# Patient Record
Sex: Female | Born: 1990
Health system: Southern US, Community
[De-identification: ages and names within clinical notes are randomized; demographics above are authoritative.]

## PROBLEM LIST (undated history)

## (undated) ENCOUNTER — Inpatient Hospital Stay (HOSPITAL_COMMUNITY): Payer: Self-pay

---

## 2010-10-04 ENCOUNTER — Emergency Department (HOSPITAL_COMMUNITY)
Admission: EM | Admit: 2010-10-04 | Discharge: 2010-10-04 | Payer: Self-pay | Source: Home / Self Care | Admitting: Emergency Medicine
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
## 2012-07-08 ENCOUNTER — Ambulatory Visit: Payer: Self-pay | Admitting: General Practice

## 2013-05-12 ENCOUNTER — Other Ambulatory Visit: Payer: Self-pay | Admitting: *Deleted

## 2013-05-12 DIAGNOSIS — IMO0001 Reserved for inherently not codable concepts without codable children: Secondary | ICD-10-CM

## 2013-05-12 MED ORDER — NORGESTIM-ETH ESTRAD TRIPHASIC 0.18/0.215/0.25 MG-25 MCG PO TABS: 1.0000 | ORAL_TABLET | Freq: Every day | ORAL | Status: DC

## 2013-05-12 NOTE — Telephone Encounter (Signed)
Patient request RF of her Trisprentec- due annual exam 09/2013

## 2013-05-23 ENCOUNTER — Inpatient Hospital Stay (HOSPITAL_COMMUNITY)
Admission: AD | Admit: 2013-05-23 | Discharge: 2013-05-23 | Disposition: A | Discharge status: Home / Self Care | Payer: 59 | Source: Ambulatory Visit | Attending: Obstetrics | Admitting: Obstetrics

## 2013-05-23 ENCOUNTER — Inpatient Hospital Stay (HOSPITAL_COMMUNITY): Payer: 59

## 2013-05-23 ENCOUNTER — Encounter (HOSPITAL_COMMUNITY): Payer: Self-pay | Admitting: *Deleted

## 2013-05-23 DIAGNOSIS — O99891 Other specified diseases and conditions complicating pregnancy: Secondary | ICD-10-CM | POA: Insufficient documentation

## 2013-05-23 DIAGNOSIS — O26899 Other specified pregnancy related conditions, unspecified trimester: Secondary | ICD-10-CM

## 2013-05-23 DIAGNOSIS — R109 Unspecified abdominal pain: Secondary | ICD-10-CM | POA: Insufficient documentation

## 2013-05-23 LAB — CBC
Hemoglobin: 12.8 g/dL (ref 12.0–15.0)
MCH: 28.6 pg (ref 26.0–34.0)
MCHC: 33.4 g/dL (ref 30.0–36.0)
Platelets: 241 10*3/uL (ref 150–400)
RDW: 12.6 % (ref 11.5–15.5)

## 2013-05-23 LAB — URINALYSIS, ROUTINE W REFLEX MICROSCOPIC
Bilirubin Urine: NEGATIVE
Glucose, UA: NEGATIVE mg/dL
Hgb urine dipstick: NEGATIVE
Ketones, ur: NEGATIVE mg/dL
Protein, ur: NEGATIVE mg/dL
Urobilinogen, UA: 1 mg/dL (ref 0.0–1.0)

## 2013-05-23 LAB — ABO/RH: ABO/RH(D): O POS

## 2013-05-23 LAB — WET PREP, GENITAL
Clue Cells Wet Prep HPF POC: NONE SEEN
Trich, Wet Prep: NONE SEEN
Yeast Wet Prep HPF POC: NONE SEEN

## 2013-05-23 NOTE — MAU Note (Signed)
Took a Plan B in August; has been divorced for 1 month and this is her ex's baby-  Frustrated with the timing of the pregnancy; crying;

## 2013-05-23 NOTE — MAU Provider Note (Signed)
History     CSN: [id number]  Arrival date and time: 05/23/13 1532   First Provider Initiated Contact with Patient 05/23/13 1812      Chief Complaint  Patient presents with  . Possible Pregnancy   HPI  [name] is a 22 y.o. female, G1 at [redacted]w[redacted]d who presents with lower abdominal pain that started 4 days ago; the pain is directly in the middle of her lower abdomen. This was an unexpected pregnancy for the patient. She took the morning after pill the day after she had unprotected intercourse and was very surprised to find out today that she was pregnant. The pain started 5 days ago.  She rates her pain a 3/10; when she bends over the pain is a 7/10. The pain is in her mid abdomen; constant, achey and it kept her up most of the night.    OB History   Grav Para Term Preterm Abortions TAB SAB Ect Mult Living   1               History reviewed. No pertinent past medical history.  History reviewed. No pertinent past surgical history.  Family History  Problem Relation Age of Onset  . Alcohol abuse Neg Hx   . Arthritis Neg Hx   . Asthma Neg Hx   . Birth defects Neg Hx   . Cancer Neg Hx   . COPD Neg Hx   . Depression Neg Hx   . Diabetes Neg Hx   . Drug abuse Neg Hx   . Early death Neg Hx   . Hearing loss Neg Hx   . Heart disease Neg Hx   . Hyperlipidemia Neg Hx   . Hypertension Neg Hx   . Kidney disease Neg Hx   . Learning disabilities Neg Hx   . Mental illness Neg Hx   . Mental retardation Neg Hx   . Miscarriages / Stillbirths Neg Hx   . Stroke Neg Hx   . Vision loss Neg Hx     History  Substance Use Topics  . Smoking status: Never Smoker   . Smokeless tobacco: Not on file  . Alcohol Use: No    Allergies: No Known Allergies  Prescriptions prior to admission  Medication Sig Dispense Refill  . levonorgestrel (PLAN B,NEXT CHOICE) 0.75 MG tablet Take 0.75 mg by mouth every 12 (twelve) hours.      . [DISCONTINUED] Norgestimate-Ethinyl Estradiol Triphasic (ORTHO  TRI-CYCLEN LO) 0.18/0.215/0.25 MG-25 MCG tab Take 1 tablet by mouth daily.  1 Package  5   Results for orders placed during the hospital encounter of 05/23/13 (from the past 24 hour(s))  URINALYSIS, ROUTINE W REFLEX MICROSCOPIC     Status: Abnormal   Collection Time    05/23/13  4:20 PM      Result Value Range   Color, Urine YELLOW  YELLOW   APPearance CLEAR  CLEAR   Specific Gravity, Urine 1.015  1.005 - 1.030   pH 6.5  5.0 - 8.0   Glucose, UA NEGATIVE  NEGATIVE mg/dL   Hgb urine dipstick NEGATIVE  NEGATIVE   Bilirubin Urine NEGATIVE  NEGATIVE   Ketones, ur NEGATIVE  NEGATIVE mg/dL   Protein, ur NEGATIVE  NEGATIVE mg/dL   Urobilinogen, UA 1.0  0.0 - 1.0 mg/dL   Nitrite NEGATIVE  NEGATIVE   Leukocytes, UA TRACE (*) NEGATIVE  URINE MICROSCOPIC-ADD ON     Status: Abnormal   Collection Time    05/23/13  4:20 PM  Result Value Range   Squamous Epithelial / LPF FEW (*) RARE   WBC, UA 0-2  <3 WBC/hpf  POCT PREGNANCY, URINE     Status: Abnormal   Collection Time    05/23/13  4:50 PM      Result Value Range   Preg Test, Ur POSITIVE (*) NEGATIVE  WET PREP, GENITAL     Status: Abnormal   Collection Time    05/23/13  6:45 PM      Result Value Range   Yeast Wet Prep HPF POC NONE SEEN  NONE SEEN   Trich, Wet Prep NONE SEEN  NONE SEEN   Clue Cells Wet Prep HPF POC NONE SEEN  NONE SEEN   WBC, Wet Prep HPF POC FEW (*) NONE SEEN  CBC     Status: None   Collection Time    05/23/13  7:00 PM      Result Value Range   WBC 8.1  4.0 - 10.5 K/uL   RBC 4.48  3.87 - 5.11 MIL/uL   Hemoglobin 12.8  12.0 - 15.0 g/dL   HCT 57.8  46.9 - 62.9 %   MCV 85.5  78.0 - 100.0 fL   MCH 28.6  26.0 - 34.0 pg   MCHC 33.4  30.0 - 36.0 g/dL   RDW 52.8  41.3 - 24.4 %   Platelets 241  150 - 400 K/uL  HCG, QUANTITATIVE, PREGNANCY     Status: Abnormal   Collection Time    05/23/13  7:00 PM      Result Value Range   hCG, Beta Chain, Quant, S 220 (*) <5 mIU/mL    Review of Systems  Constitutional:  Positive for chills. Negative for fever.  Eyes: Negative for blurred vision.  Gastrointestinal: Positive for nausea, abdominal pain and diarrhea. Negative for heartburn, vomiting and constipation.       Middle abdominal pain, worse when she bends over.  The pain affected her sleep last night   Genitourinary: Positive for frequency. Negative for dysuria and urgency.       No vaginal discharge. No vaginal bleeding. No dysuria.   Neurological: Positive for headaches.   Physical Exam   Blood pressure 122/88, pulse 89, temperature 98.7 F (37.1 C), temperature source Oral, resp. rate 16, height 5\' 7"  (1.702 m), weight 66.134 kg (145 lb 12.8 oz), last menstrual period 04/15/2013, SpO2 100.00%.  Physical Exam  Constitutional: She is oriented to person, place, and time. She appears well-developed and well-nourished. No distress.  HENT:  Head: Normocephalic.  Eyes: Pupils are equal, round, and reactive to light.  Neck: Neck supple.  GI: Soft. She exhibits no distension. There is tenderness. There is guarding. There is no rebound.  Lower, mid abdominal pain.  Genitourinary:  Speculum exam: Vagina - Small amount of creamy discharge, no odor Cervix - minimal contact bleeding; ectropion noted  Bimanual exam: Cervix closed Uterus non tender, Gravid; normal size Mid/left adnexal tenderness, no masses bilaterally GC/Chlam, wet prep done Chaperone present for exam.   Neurological: She is alert and oriented to person, place, and time.  Skin: Skin is warm. She is not diaphoretic.    Results for orders placed during the hospital encounter of 05/23/13 (from the past 24 hour(s))  URINALYSIS, ROUTINE W REFLEX MICROSCOPIC     Status: Abnormal   Collection Time    05/23/13  4:20 PM      Result Value Range   Color, Urine YELLOW  YELLOW   APPearance CLEAR  CLEAR  Specific Gravity, Urine 1.015  1.005 - 1.030   pH 6.5  5.0 - 8.0   Glucose, UA NEGATIVE  NEGATIVE mg/dL   Hgb urine dipstick  NEGATIVE  NEGATIVE   Bilirubin Urine NEGATIVE  NEGATIVE   Ketones, ur NEGATIVE  NEGATIVE mg/dL   Protein, ur NEGATIVE  NEGATIVE mg/dL   Urobilinogen, UA 1.0  0.0 - 1.0 mg/dL   Nitrite NEGATIVE  NEGATIVE   Leukocytes, UA TRACE (*) NEGATIVE  URINE MICROSCOPIC-ADD ON     Status: Abnormal   Collection Time    05/23/13  4:20 PM      Result Value Range   Squamous Epithelial / LPF FEW (*) RARE   WBC, UA 0-2  <3 WBC/hpf  POCT PREGNANCY, URINE     Status: Abnormal   Collection Time    05/23/13  4:50 PM      Result Value Range   Preg Test, Ur POSITIVE (*) NEGATIVE  WET PREP, GENITAL     Status: Abnormal   Collection Time    05/23/13  6:45 PM      Result Value Range   Yeast Wet Prep HPF POC NONE SEEN  NONE SEEN   Trich, Wet Prep NONE SEEN  NONE SEEN   Clue Cells Wet Prep HPF POC NONE SEEN  NONE SEEN   WBC, Wet Prep HPF POC FEW (*) NONE SEEN  CBC     Status: None   Collection Time    05/23/13  7:00 PM      Result Value Range   WBC 8.1  4.0 - 10.5 K/uL   RBC 4.48  3.87 - 5.11 MIL/uL   Hemoglobin 12.8  12.0 - 15.0 g/dL   HCT 16.1  09.6 - 04.5 %   MCV 85.5  78.0 - 100.0 fL   MCH 28.6  26.0 - 34.0 pg   MCHC 33.4  30.0 - 36.0 g/dL   RDW 40.9  81.1 - 91.4 %   Platelets 241  150 - 400 K/uL  ABO/RH     Status: None   Collection Time    05/23/13  7:00 PM      Result Value Range   ABO/RH(D) O POS    HCG, QUANTITATIVE, PREGNANCY     Status: Abnormal   Collection Time    05/23/13  7:00 PM      Result Value Range   hCG, Beta Chain, Quant, S 220 (*) <5 mIU/mL   Clinical Data: Abdominal pain. Positive pregnancy test. 5 weeks 3  days pregnant by last menstrual period. Beta HCG of 220.  OBSTETRIC <14 WK [place] AND TRANSVAGINAL OB US  Technique: Both transabdominal and transvaginal ultrasound  examinations were performed for complete evaluation of the  gestation as well as the maternal uterus, adnexal regions, and  pelvic cul-de-sac. Transvaginal technique was performed to assess  early  pregnancy.  Comparison: None.  Intrauterine gestational sac: Not visualized. Equivocal trace  endometrial fluid on image 35. 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  Yolk sac: Not visualized  Embryo: Not visualized  Cardiac Activity: None visualized  Maternal uterus/adnexae:  Left ovary only visualized transabdominally and within normal  limits. A right ovarian corpus luteal cyst measures 2.1 x 1.5 x  1.9 cm on image 29.  No significant free fluid.  IMPRESSION:  Lack of visualization of intrauterine gestational sac, yolk sac, or  embryo. Given the low beta HCG level and the gestational age by  last menstrual period, this is nonspecific. Considerations include  early non  visualized intrauterine pregnancy, nonviable pregnancy,  or ectopic pregnancy. Short-term imaging and beta HCG follow-up  suggested.  Right ovarian corpus luteal cyst.  Original Report Authenticated By: Jeronimo Greaves, M.D.     MAU Course  Procedures  MDM CBC Beta Hcg ABO/RH Wet prep GC/Chlamydia   [place]   LABS and Ultrasound results were reviewed with the patient. Assessment and Plan   Report given to Jeani Sow FNP who resumes care of the patient at 20:00 [facility], JENNIFER IRENE FNP-C 05/23/2013, 7:51 PM   A:  Early pregnancy with lower abdominal pain  [redacted]w[redacted]d by LMP       P:  Return on Sunday, 05/25/13 for repeat BHCG      Return for worsening sxs

## 2013-05-23 NOTE — MAU Note (Signed)
Upset about + UPT;

## 2013-05-23 NOTE — MAU Note (Signed)
Patient states she has positive home pregnancy tests. Has been having bad cramping for about 5 days. Nausea, chills, diarrhea.

## 2013-05-24 LAB — GC/CHLAMYDIA PROBE AMP
CT Probe RNA: NEGATIVE
GC Probe RNA: NEGATIVE

## 2013-05-25 ENCOUNTER — Inpatient Hospital Stay (HOSPITAL_COMMUNITY)
Admission: AD | Admit: 2013-05-25 | Discharge: 2013-05-25 | Disposition: A | Discharge status: Home / Self Care | Payer: 59 | Source: Ambulatory Visit | Attending: Obstetrics | Admitting: Obstetrics

## 2013-05-25 DIAGNOSIS — N831 Corpus luteum cyst of ovary, unspecified side: Secondary | ICD-10-CM | POA: Insufficient documentation

## 2013-05-25 DIAGNOSIS — O26899 Other specified pregnancy related conditions, unspecified trimester: Secondary | ICD-10-CM

## 2013-05-25 DIAGNOSIS — O99891 Other specified diseases and conditions complicating pregnancy: Secondary | ICD-10-CM | POA: Insufficient documentation

## 2013-05-25 DIAGNOSIS — R109 Unspecified abdominal pain: Secondary | ICD-10-CM | POA: Insufficient documentation

## 2013-05-25 DIAGNOSIS — O34599 Maternal care for other abnormalities of gravid uterus, unspecified trimester: Secondary | ICD-10-CM | POA: Insufficient documentation

## 2013-05-25 MED ORDER — PRENATAL PLUS 27-1 MG PO TABS: 1.0000 | ORAL_TABLET | Freq: Every day | ORAL | Status: AC

## 2013-05-25 NOTE — MAU Note (Signed)
Pt states here for follow up bhcg, having pain still, however better than 2 days ago. Denies bleeding. Feels achy pain at present, more like discomfort.

## 2013-05-25 NOTE — MAU Provider Note (Signed)
Chief Complaint: Follow-up   None    SUBJECTIVE HPI: Amy Pratt is a 22 y.o. G1P0 at [redacted]w[redacted]d by LMP who presents for F/U quant. She is no longer having any abd pain and has had no VB.   No past medical history on file. OB History  Gravida Para Term Preterm AB SAB TAB Ectopic Multiple Living  1             # Outcome Date GA Lbr Len/2nd Weight Sex Delivery Anes PTL Lv  1 CUR              No past surgical history on file. History   Social History  . Marital Status: Divorced    Spouse Name: N/A    Number of Children: N/A  . Years of Education: N/A   Occupational History  . Not on file.   Social History Main Topics  . Smoking status: Never Smoker   . Smokeless tobacco: Not on file  . Alcohol Use: No  . Drug Use: No  . Sexual Activity: Not on file   Other Topics Concern  . Not on file   Social History Narrative  . No narrative on file   No current facility-administered medications on file prior to encounter.   No current outpatient prescriptions on file prior to encounter.   No Known Allergies  ROS: Pertinent items in HPI  OBJECTIVE Blood pressure 113/75, pulse 78, temperature 98.2 F (36.8 C), temperature source Oral, resp. rate 16, height 5\' 7"  (1.702 m), weight 65.431 kg (144 lb 4 oz), last menstrual period 04/15/2013. GENERAL: Well-developed, well-nourished female in no acute distress.  HEENT: Normocephalic HEART: normal rate RESP: normal effort ABDOMEN: Soft, non-tender EXTREMITIES: Nontender, no edema NEURO: Alert and oriented  LAB RESULTS Results for orders placed during the hospital encounter of 05/25/13 (from the past 24 hour(s))  HCG, QUANTITATIVE, PREGNANCY     Status: Abnormal   Collection Time    05/25/13  5:37 PM      Result Value Range   hCG, Beta Chain, Quant, S 691 (*) <5 mIU/mL    IMAGING US Ob Comp Less 14 Wks  05/23/2013   *RADIOLOGY REPORT*  Clinical Data: Abdominal pain.  Positive pregnancy test.  5 weeks 3 days pregnant by last  menstrual period.  Beta HCG of 220.  OBSTETRIC <14 WK [place] AND TRANSVAGINAL OB US  Technique:  Both transabdominal and transvaginal ultrasound examinations were performed for complete evaluation of the gestation as well as the maternal uterus, adnexal regions, and pelvic cul-de-sac.  Transvaginal technique was performed to assess early pregnancy.  Comparison:  None.  Intrauterine gestational sac:  Not visualized.  Equivocal trace endometrial fluid on image 35.  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  Yolk sac: Not visualized Embryo: Not visualized Cardiac Activity: None visualized  Maternal uterus/adnexae: Left ovary only visualized transabdominally and within normal limits.  A right ovarian corpus luteal cyst measures 2.1 x 1.5 x 1.9 cm on image 29.  No significant free fluid.  IMPRESSION: Lack of visualization of intrauterine gestational sac, yolk sac, or embryo.  Given the low beta HCG level and the gestational age by last menstrual period, this is nonspecific.  Considerations include early non visualized intrauterine pregnancy, nonviable pregnancy, or ectopic pregnancy.  Short-term imaging and beta HCG follow-up suggested.  Right ovarian corpus luteal cyst.   Original Report Authenticated By: Jeronimo Greaves, M.D.   US Ob Transvaginal  05/23/2013   *RADIOLOGY REPORT*  Clinical Data: Abdominal pain.  Positive  pregnancy test.  5 weeks 3 days pregnant by last menstrual period.  Beta HCG of 220.  OBSTETRIC <14 WK [place] AND TRANSVAGINAL OB US  Technique:  Both transabdominal and transvaginal ultrasound examinations were performed for complete evaluation of the gestation as well as the maternal uterus, adnexal regions, and pelvic cul-de-sac.  Transvaginal technique was performed to assess early pregnancy.  Comparison:  None.  Intrauterine gestational sac:  Not visualized.  Equivocal trace endometrial fluid on image 35.  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  Yolk sac: Not visualized Embryo: Not visualized Cardiac Activity: None visualized   Maternal uterus/adnexae: Left ovary only visualized transabdominally and within normal limits.  A right ovarian corpus luteal cyst measures 2.1 x 1.5 x 1.9 cm on image 29.  No significant free fluid.  IMPRESSION: Lack of visualization of intrauterine gestational sac, yolk sac, or embryo.  Given the low beta HCG level and the gestational age by last menstrual period, this is nonspecific.  Considerations include early non visualized intrauterine pregnancy, nonviable pregnancy, or ectopic pregnancy.  Short-term imaging and beta HCG follow-up suggested.  Right ovarian corpus luteal cyst.   Original Report Authenticated By: Jeronimo Greaves, M.D.    MAU COURSE  ASSESSMENT 1. Abdominal pain in pregnancy   Appropriate rise in quant beta hCG  PLAN Discharge home with reassurance    Medication List         prenatal vitamin w/FE, FA 27-1 MG Tabs tablet  Take 1 tablet by mouth daily.       F/U US for viability in 1 wk with result to Dr. Clearance Coots. Pt to schedule her appointment there next week.     Danae Orleans, CNM 05/25/2013  6:24 PM

## 2013-06-04 ENCOUNTER — Ambulatory Visit (HOSPITAL_COMMUNITY): Payer: 59 | Attending: Obstetrics and Gynecology

## 2013-06-11 ENCOUNTER — Encounter: Payer: Self-pay | Admitting: Obstetrics

## 2013-07-03 ENCOUNTER — Encounter: Payer: Self-pay | Admitting: Obstetrics

## 2013-07-24 ENCOUNTER — Other Ambulatory Visit: Payer: Self-pay

## 2013-08-07 ENCOUNTER — Other Ambulatory Visit: Payer: Self-pay | Admitting: *Deleted

## 2013-08-07 MED ORDER — NORGESTIM-ETH ESTRAD TRIPHASIC 0.18/0.215/0.25 MG-25 MCG PO TABS: 1.0000 | ORAL_TABLET | Freq: Every day | ORAL | Status: DC

## 2014-03-10 ENCOUNTER — Encounter (HOSPITAL_COMMUNITY): Payer: Self-pay

## 2014-03-28 ENCOUNTER — Encounter (HOSPITAL_COMMUNITY): Payer: Self-pay | Admitting: *Deleted

## 2014-07-07 ENCOUNTER — Other Ambulatory Visit: Payer: Self-pay | Admitting: Obstetrics

## 2014-07-20 ENCOUNTER — Encounter (HOSPITAL_COMMUNITY): Payer: Self-pay | Admitting: *Deleted

## 2014-08-21 ENCOUNTER — Other Ambulatory Visit: Payer: Self-pay | Admitting: Obstetrics

## 2014-09-11 IMAGING — US US OB TRANSVAGINAL
1 series · 13 of 28 positions shown · non-contrast
Comparison: None.

CLINICAL DATA: Abdominal pain.  Positive pregnancy test.  5 weeks 3
days pregnant by last menstrual period.  Beta HCG of 220.

OBSTETRIC <14 WK US AND TRANSVAGINAL OB US
TECHNIQUE: Both transabdominal and transvaginal ultrasound
examinations were performed for complete evaluation of the
gestation as well as the maternal uterus, adnexal regions, and
pelvic cul-de-sac.  Transvaginal technique was performed to assess
early pregnancy.

[Series 1: us ob comp less 14 wks · 13 of 34 slices shown]
[im 2/34]
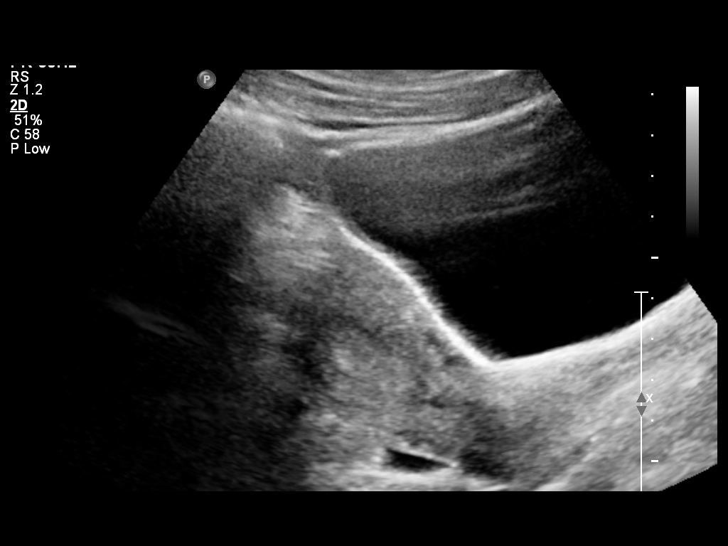
[im 4/34]
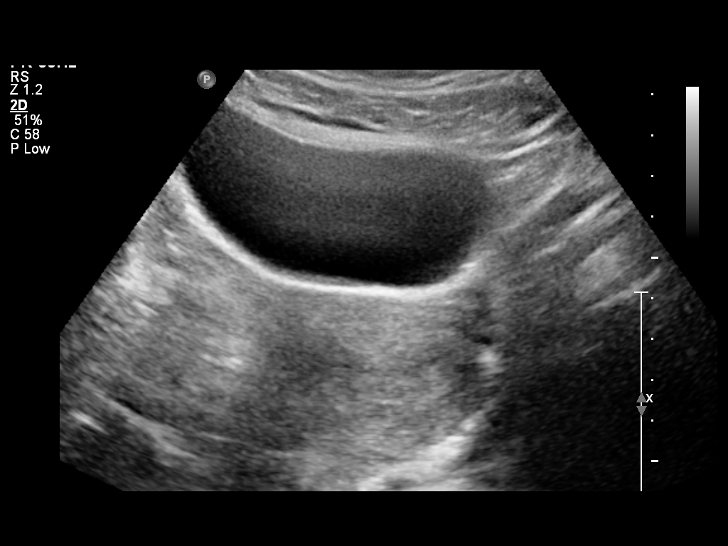
[im 7/34]
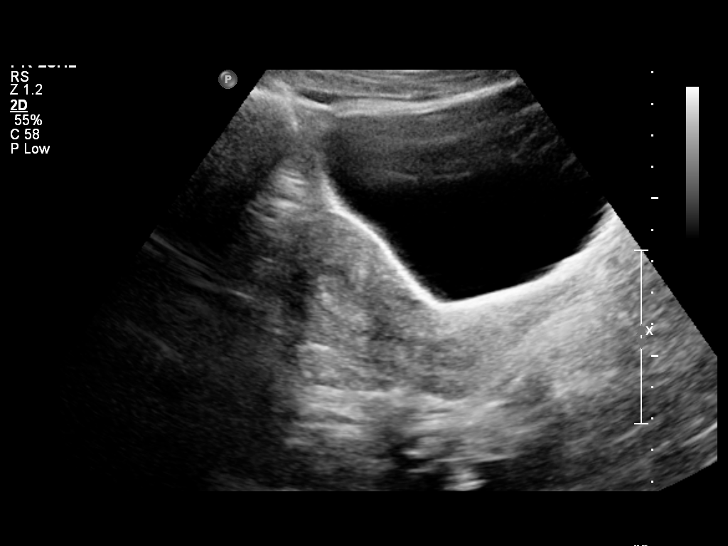
[im 9/34]
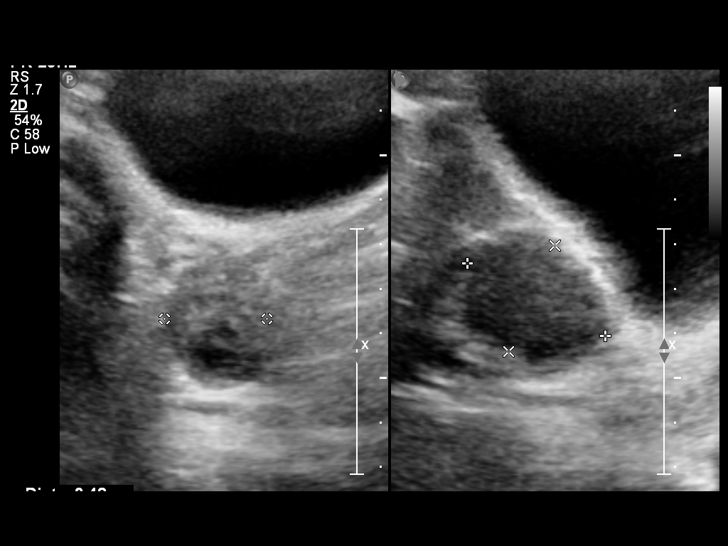
[im 12/34]
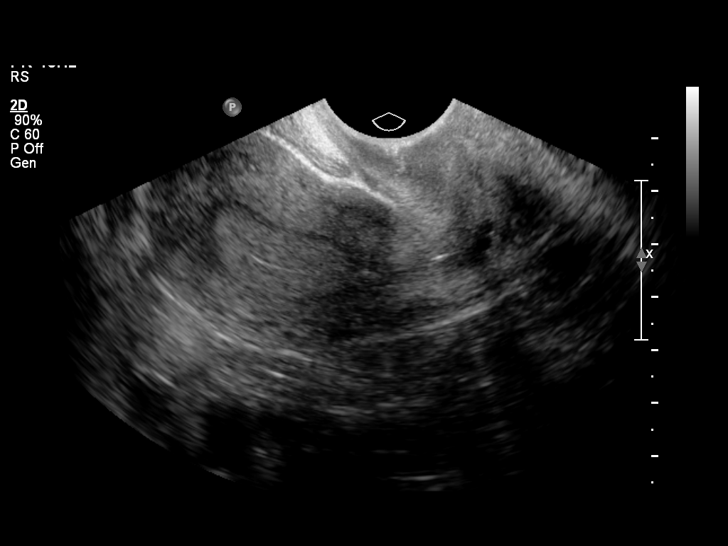
[im 14/34]
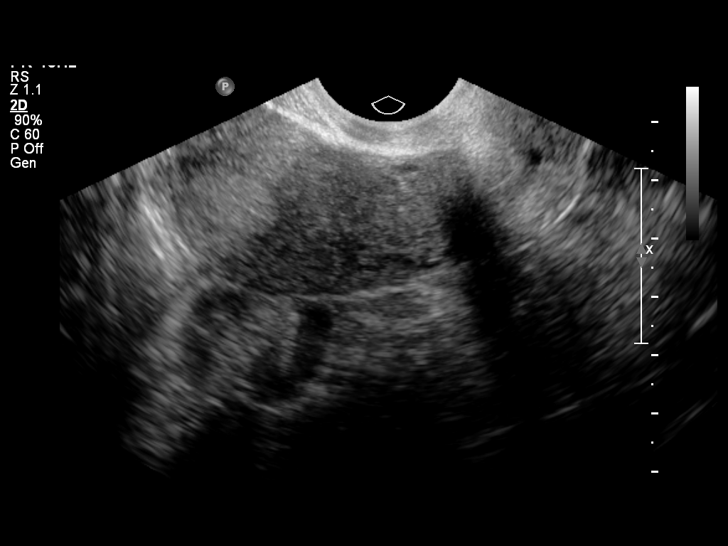
[im 18/34]
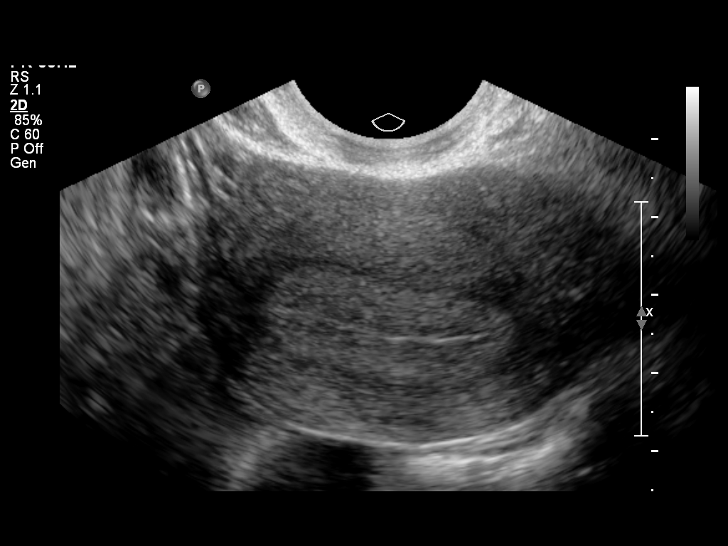
[im 20/34]
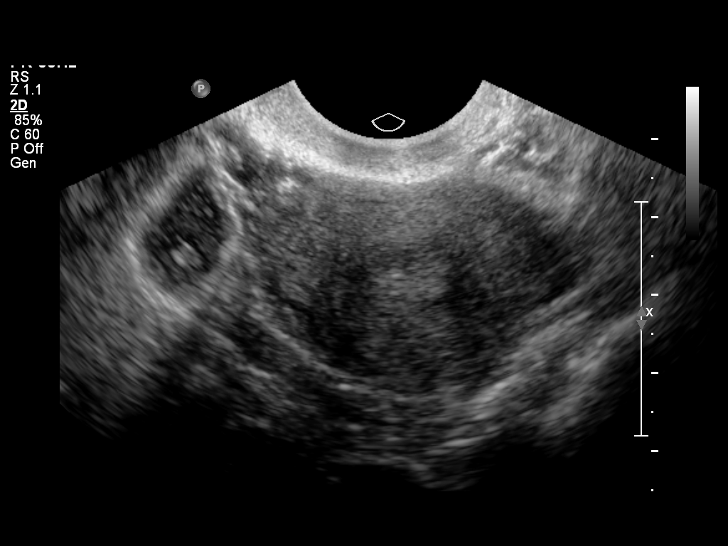
[im 23/34]
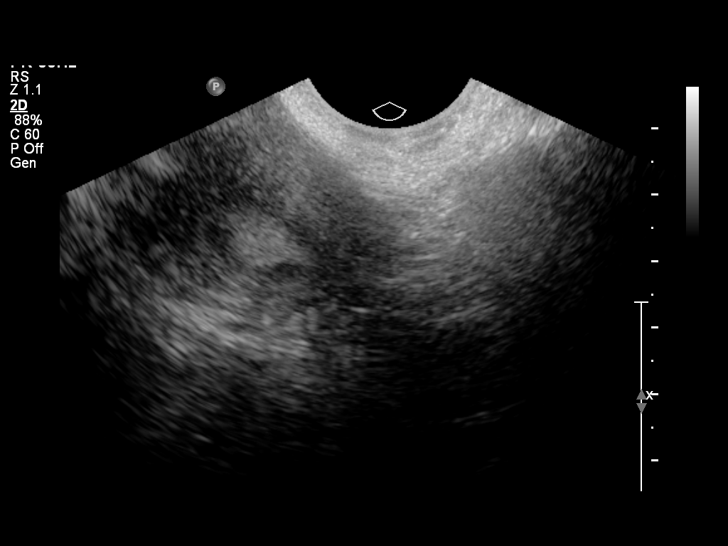
[im 25/34]
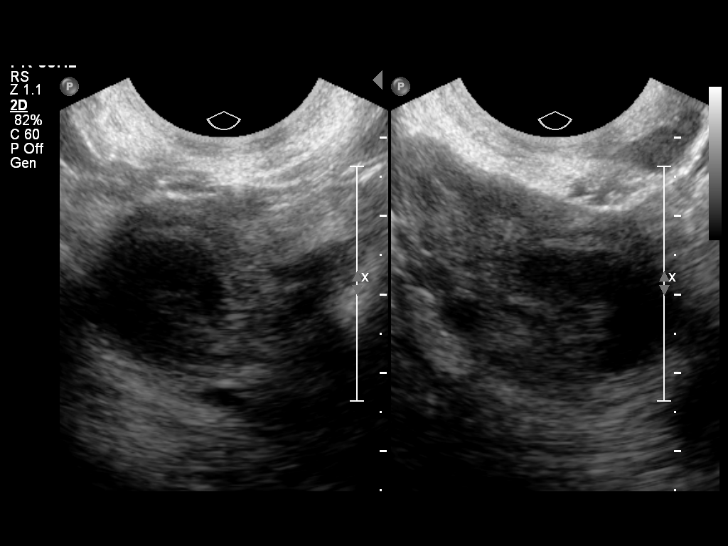
[im 27/34]
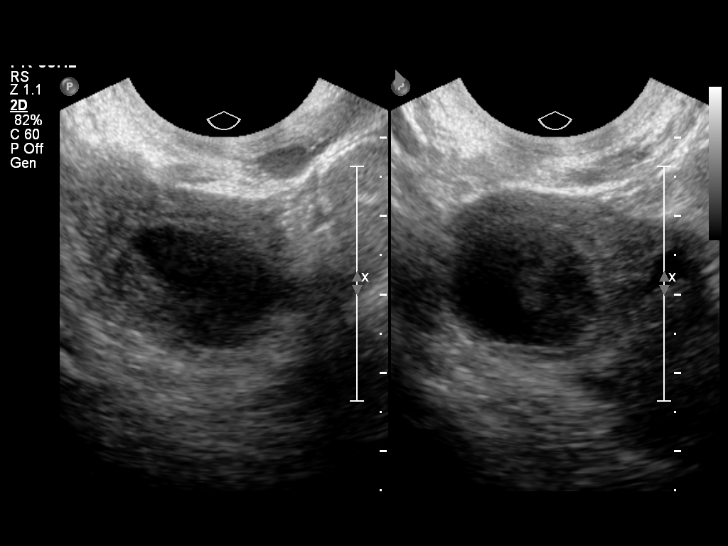
[im 30/34]
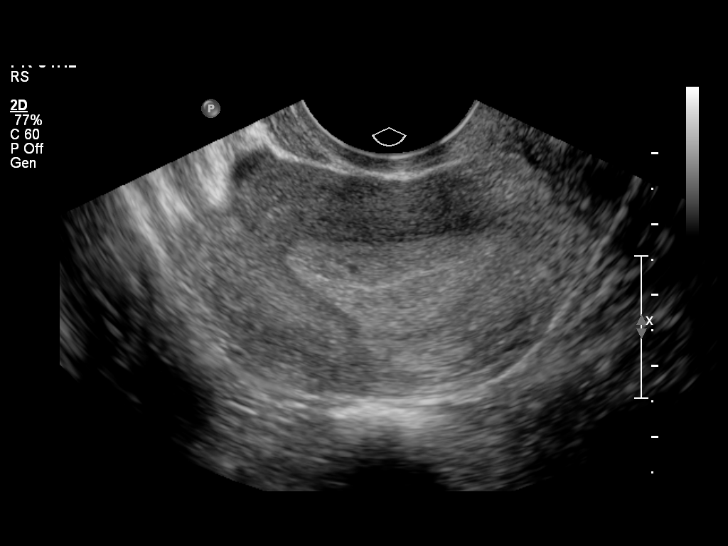
[im 32/34]
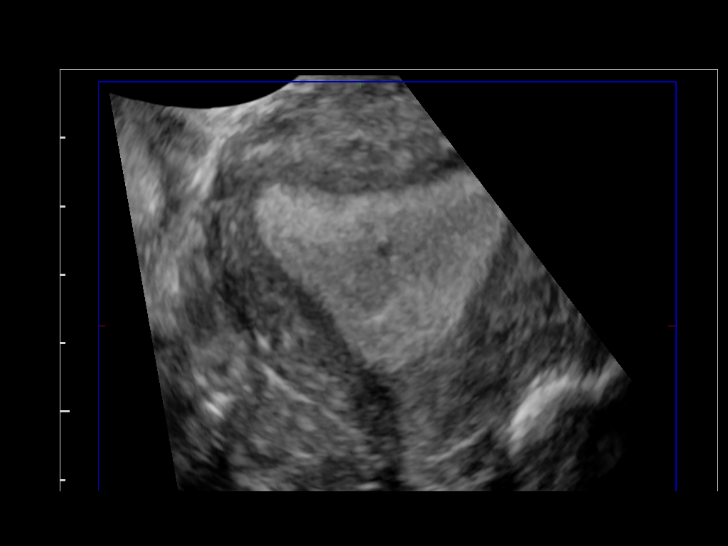

[13 of 28 positions shown; findings below may reference images not displayed]

Intrauterine gestational sac:  Not visualized.  Equivocal trace
endometrial fluid on image 35.  Probable arcuate type uterus.

Yolk sac: Not visualized
Embryo: Not visualized
Cardiac Activity: None visualized

Maternal uterus/adnexae:
Left ovary only visualized transabdominally and within normal
limits.  A right ovarian corpus luteal cyst measures 2.1 x 1.5 x
1.9 cm on image 29.

No significant free fluid.
IMPRESSION: Lack of visualization of intrauterine gestational sac, yolk sac, or
embryo.  Given the low beta HCG level and the gestational age by
last menstrual period, this is nonspecific.  Considerations include
early non visualized intrauterine pregnancy, nonviable pregnancy,
or ectopic pregnancy.  Short-term imaging and beta HCG follow-up
suggested.

Right ovarian corpus luteal cyst.

## 2014-09-22 ENCOUNTER — Other Ambulatory Visit: Payer: Self-pay | Admitting: Obstetrics

## 2014-09-22 NOTE — Telephone Encounter (Signed)
Please advise 

## 2014-09-23 ENCOUNTER — Telehealth: Payer: Self-pay | Admitting: *Deleted

## 2014-09-23 NOTE — Telephone Encounter (Signed)
Patient has an annual exam scheduled for Feb. 16, 2016. Patient is requesting a refill on Tri-Sprintec until she is seen.  Attempted to call patient and left message for her to contact the office.

## 2014-10-05 NOTE — Telephone Encounter (Signed)
Attempted to contact patient. Left a message for patient to contact the office if there was anything we could help her with. Advised cal would be re-filed.

## 2014-11-02 ENCOUNTER — Encounter: Payer: Self-pay | Admitting: Obstetrics

## 2014-11-02 ENCOUNTER — Ambulatory Visit (INDEPENDENT_AMBULATORY_CARE_PROVIDER_SITE_OTHER): Payer: 59 | Admitting: Obstetrics

## 2014-11-02 VITALS — BP 116/73 | HR 61 | Ht 67.0 in | Wt 140.0 lb

## 2014-11-02 DIAGNOSIS — Z01419 Encounter for gynecological examination (general) (routine) without abnormal findings: Secondary | ICD-10-CM

## 2014-11-02 DIAGNOSIS — Z3041 Encounter for surveillance of contraceptive pills: Secondary | ICD-10-CM

## 2014-11-02 DIAGNOSIS — Z0000 Encounter for general adult medical examination without abnormal findings: Secondary | ICD-10-CM

## 2014-11-02 DIAGNOSIS — N898 Other specified noninflammatory disorders of vagina: Secondary | ICD-10-CM

## 2014-11-02 MED ORDER — PNV PRENATAL PLUS MULTIVITAMIN 27-1 MG PO TABS: 1.0000 | ORAL_TABLET | Freq: Every day | ORAL | Status: DC

## 2014-11-02 MED ORDER — NORGESTIM-ETH ESTRAD TRIPHASIC 0.18/0.215/0.25 MG-35 MCG PO TABS: 1.0000 | ORAL_TABLET | Freq: Every day | ORAL | Status: DC

## 2014-11-03 ENCOUNTER — Encounter: Payer: Self-pay | Admitting: Obstetrics

## 2014-11-03 LAB — PAP IG W/ RFLX HPV ASCU

## 2014-11-03 NOTE — Progress Notes (Signed)
Subjective:        Amy Pratt is a 24 y.o. female here for a routine exam.  Current complaints: None.    Personal health questionnaire:  Is patient Ashkenazi Jewish, have a family history of breast and/or ovarian cancer: no Is there a family history of uterine cancer diagnosed at age < 4450, gastrointestinal cancer, urinary tract cancer, family member who is a Personnel officerLynch syndrome-associated carrier: no Is the patient overweight and hypertensive, family history of diabetes, personal history of gestational diabetes, preeclampsia or PCOS: no Is patient over 6455, have PCOS,  family history of premature CHD under age 25, diabetes, smoke, have hypertension or peripheral artery disease:  no At any time, has a partner hit, kicked or otherwise hurt or frightened you?: no Over the past 2 weeks, have you felt down, depressed or hopeless?: no Over the past 2 weeks, have you felt little interest or pleasure in doing things?:no   Gynecologic History Patient's last menstrual period was 10/10/2014. Contraception: abstinence Last Pap: 2015. Results were: normal Last mammogram: n/a. Results were: n/a  Obstetric History OB History  Gravida Para Term Preterm AB SAB TAB Ectopic Multiple Living  1             # Outcome Date GA Lbr Len/2nd Weight Sex Delivery Anes PTL Lv  1 Gravida              Comments: System Generated. Please review and update pregnancy details.      History reviewed. No pertinent past medical history.  History reviewed. No pertinent past surgical history.   Current outpatient prescriptions:  .  Norgestimate-Ethinyl Estradiol Triphasic (TRI-SPRINTEC) 0.18/0.215/0.25 MG-35 MCG tablet, Take 1 tablet by mouth daily., Disp: 28 tablet, Rfl: 0 .  Prenatal Vit-Fe Fumarate-FA (PNV PRENATAL PLUS MULTIVITAMIN) 27-1 MG TABS, Take 1 tablet by mouth daily before breakfast., Disp: 30 tablet, Rfl: 11 .  prenatal vitamin w/FE, FA (PRENATAL 1 + 1) 27-1 MG TABS tablet, Take 1 tablet by mouth  daily. (Patient not taking: Reported on 11/02/2014), Disp: 30 each, Rfl: 0 No Known Allergies  History  Substance Use Topics  . Smoking status: Never Smoker   . Smokeless tobacco: Not on file  . Alcohol Use: No    Family History  Problem Relation Age of Onset  . Alcohol abuse Neg Hx   . Arthritis Neg Hx   . Asthma Neg Hx   . Birth defects Neg Hx   . Cancer Neg Hx   . COPD Neg Hx   . Depression Neg Hx   . Diabetes Neg Hx   . Drug abuse Neg Hx   . Early death Neg Hx   . Hearing loss Neg Hx   . Heart disease Neg Hx   . Hyperlipidemia Neg Hx   . Hypertension Neg Hx   . Kidney disease Neg Hx   . Learning disabilities Neg Hx   . Mental illness Neg Hx   . Mental retardation Neg Hx   . Miscarriages / Stillbirths Neg Hx   . Stroke Neg Hx   . Vision loss Neg Hx       Review of Systems  Constitutional: negative for fatigue and weight loss Respiratory: negative for cough and wheezing Cardiovascular: negative for chest pain, fatigue and palpitations Gastrointestinal: negative for abdominal pain and change in bowel habits Musculoskeletal:negative for myalgias Neurological: negative for gait problems and tremors Behavioral/Psych: negative for abusive relationship, depression Endocrine: negative for temperature intolerance   Genitourinary:negative for abnormal  menstrual periods, genital lesions, hot flashes, sexual problems and vaginal discharge Integument/breast: negative for breast lump, breast tenderness, nipple discharge and skin lesion(s)    Objective:       BP 116/73 mmHg  Pulse 61  Ht  (1.702 m)  Wt 140 lb (63.504 kg)  BMI 21.92 kg/m2  LMP 10/10/2014 General:   alert  Skin:   no rash or abnormalities  Lungs:   clear to auscultation bilaterally  Heart:   regular rate and rhythm, S1, S2 normal, no murmur, click, rub or gallop  Breasts:   normal without suspicious masses, skin or nipple changes or axillary nodes  Abdomen:  normal findings: no organomegaly, soft,  non-tender and no hernia  Pelvis:  External genitalia: normal general appearance Urinary system: urethral meatus normal and bladder without fullness, nontender Vaginal: normal without tenderness, induration or masses Cervix: normal appearance Adnexa: normal bimanual exam Uterus: anteverted and non-tender, normal size   Lab Review Urine pregnancy test Labs reviewed yes Radiologic studies reviewed no    Assessment:    Healthy female exam.    Plan:    Education reviewed: safe sex/STD prevention, self breast exams and weight bearing exercise. Contraception: OCP (estrogen/progesterone). Follow up in: 1 year.   Meds ordered this encounter  Medications  . Norgestimate-Ethinyl Estradiol Triphasic (TRI-SPRINTEC) 0.18/0.215/0.25 MG-35 MCG tablet    Sig: Take 1 tablet by mouth daily.    Dispense:  28 tablet    Refill:  0  . Prenatal Vit-Fe Fumarate-FA (PNV PRENATAL PLUS MULTIVITAMIN) 27-1 MG TABS    Sig: Take 1 tablet by mouth daily before breakfast.    Dispense:  30 tablet    Refill:  11   Orders Placed This Encounter  Procedures  . SureSwab, Vaginosis/Vaginitis Plus

## 2014-11-05 LAB — SURESWAB, VAGINOSIS/VAGINITIS PLUS
ATOPOBIUM VAGINAE: NOT DETECTED Log (cells/mL)
C. ALBICANS, DNA: DETECTED — AB
C. GLABRATA, DNA: NOT DETECTED
C. PARAPSILOSIS, DNA: NOT DETECTED
C. TRACHOMATIS RNA, TMA: NOT DETECTED
C. tropicalis, DNA: NOT DETECTED
Gardnerella vaginalis: NOT DETECTED Log (cells/mL)
LACTOBACILLUS SPECIES: >8 Log (cells/mL)
MEGASPHAERA SPECIES: NOT DETECTED Log (cells/mL)
N. gonorrhoeae RNA, TMA: NOT DETECTED
T. VAGINALIS RNA, QL TMA: NOT DETECTED

## 2014-11-06 ENCOUNTER — Other Ambulatory Visit: Payer: Self-pay | Admitting: Obstetrics

## 2014-11-06 DIAGNOSIS — B373 Candidiasis of vulva and vagina: Secondary | ICD-10-CM

## 2014-11-06 DIAGNOSIS — B3731 Acute candidiasis of vulva and vagina: Secondary | ICD-10-CM

## 2014-11-06 MED ORDER — FLUCONAZOLE 150 MG PO TABS: 150.0000 mg | ORAL_TABLET | Freq: Once | ORAL | Status: DC

## 2014-11-13 ENCOUNTER — Other Ambulatory Visit: Payer: Self-pay | Admitting: *Deleted

## 2014-11-13 DIAGNOSIS — B373 Candidiasis of vulva and vagina: Secondary | ICD-10-CM

## 2014-11-13 DIAGNOSIS — B3731 Acute candidiasis of vulva and vagina: Secondary | ICD-10-CM

## 2014-11-13 MED ORDER — FLUCONAZOLE 150 MG PO TABS: 150.0000 mg | ORAL_TABLET | Freq: Once | ORAL | Status: DC

## 2014-11-17 ENCOUNTER — Other Ambulatory Visit: Payer: Self-pay | Admitting: *Deleted

## 2014-11-17 DIAGNOSIS — B3731 Acute candidiasis of vulva and vagina: Secondary | ICD-10-CM

## 2014-11-17 DIAGNOSIS — Z3041 Encounter for surveillance of contraceptive pills: Secondary | ICD-10-CM

## 2014-11-17 DIAGNOSIS — B373 Candidiasis of vulva and vagina: Secondary | ICD-10-CM

## 2014-11-17 MED ORDER — NORGESTIM-ETH ESTRAD TRIPHASIC 0.18/0.215/0.25 MG-35 MCG PO TABS: 1.0000 | ORAL_TABLET | Freq: Every day | ORAL | Status: AC

## 2014-11-20 ENCOUNTER — Ambulatory Visit (INDEPENDENT_AMBULATORY_CARE_PROVIDER_SITE_OTHER): Payer: 59 | Admitting: Obstetrics

## 2014-11-20 ENCOUNTER — Encounter: Payer: Self-pay | Admitting: Obstetrics

## 2014-11-20 ENCOUNTER — Other Ambulatory Visit: Payer: Self-pay | Admitting: Obstetrics

## 2014-11-20 VITALS — BP 127/84 | HR 84 | Temp 98.1°F | Ht 67.0 in | Wt 135.0 lb

## 2014-11-20 DIAGNOSIS — Z3202 Encounter for pregnancy test, result negative: Secondary | ICD-10-CM

## 2014-11-20 DIAGNOSIS — R87612 Low grade squamous intraepithelial lesion on cytologic smear of cervix (LGSIL): Secondary | ICD-10-CM | POA: Diagnosis not present

## 2014-11-20 DIAGNOSIS — Z01812 Encounter for preprocedural laboratory examination: Secondary | ICD-10-CM

## 2014-11-20 LAB — POCT URINE PREGNANCY: Preg Test, Ur: NEGATIVE

## 2014-11-20 NOTE — Addendum Note (Signed)
Addended by: Odessa FlemingBOHNE, Brolin Dambrosia M on: 11/20/2014 01:22 PM   Modules accepted: Orders

## 2014-11-20 NOTE — Progress Notes (Signed)
Colposcopy Procedure Note  Indications: Pap smear 1 months ago showed: low-grade squamous intraepithelial neoplasia (LGSIL - encompassing HPV,mild dysplasia,CIN I). The prior pap showed low-grade squamous intraepithelial neoplasia (LGSIL - encompassing HPV,mild dysplasia,CIN I).  Prior cervical/vaginal disease: normal exam without visible pathology. Prior cervical treatment: no treatment.  Procedure Details  The risks and benefits of the procedure and Written informed consent obtained.  A time-out was performed confirming the patient, procedure and allergy status  Speculum placed in vagina and excellent visualization of cervix achieved, cervix swabbed x 3 with acetic acid solution.  Findings: Cervix: no visible lesions; SCJ visualized 360 degrees without lesions, endocervical curettage performed, cervical biopsies taken at 6 and 12  o'clock, specimen labelled and sent to pathology and hemostasis achieved with silver nitrate.   Vaginal inspection: normal without visible lesions. Vulvar colposcopy: vulvar colposcopy not performed.   Physical Exam   Specimens: ECC and cervical biopsies  Complications: none.  Plan: Specimens labelled and sent to Pathology. Will base further treatment on Pathology findings. Post biopsy instructions given to patient.

## 2014-11-24 LAB — SURESWAB, VAGINOSIS/VAGINITIS PLUS
Atopobium vaginae: NOT DETECTED Log (cells/mL)
C. GLABRATA, DNA: NOT DETECTED
C. albicans, DNA: DETECTED — AB
C. parapsilosis, DNA: NOT DETECTED
C. trachomatis RNA, TMA: NOT DETECTED
C. tropicalis, DNA: NOT DETECTED
Gardnerella vaginalis: 4.7 Log (cells/mL)
LACTOBACILLUS SPECIES: 7.5 Log (cells/mL)
MEGASPHAERA SPECIES: NOT DETECTED Log (cells/mL)
N. gonorrhoeae RNA, TMA: NOT DETECTED
T. vaginalis RNA, QL TMA: NOT DETECTED

## 2014-11-25 ENCOUNTER — Other Ambulatory Visit: Payer: Self-pay | Admitting: Obstetrics

## 2014-11-25 DIAGNOSIS — B373 Candidiasis of vulva and vagina: Secondary | ICD-10-CM

## 2014-11-25 DIAGNOSIS — B3731 Acute candidiasis of vulva and vagina: Secondary | ICD-10-CM

## 2014-11-25 MED ORDER — FLUCONAZOLE 150 MG PO TABS: 150.0000 mg | ORAL_TABLET | Freq: Once | ORAL | Status: AC
